# Patient Record
Sex: Female | Born: 1970 | Race: White | Hispanic: No | Marital: Married | Smoking: Never smoker
Health system: Southern US, Community
[De-identification: ages and names within clinical notes are randomized; demographics above are authoritative.]

## PROBLEM LIST (undated history)

## (undated) DIAGNOSIS — F319 Bipolar disorder, unspecified: Secondary | ICD-10-CM

## (undated) DIAGNOSIS — K5792 Diverticulitis of intestine, part unspecified, without perforation or abscess without bleeding: Secondary | ICD-10-CM

## (undated) DIAGNOSIS — F419 Anxiety disorder, unspecified: Secondary | ICD-10-CM

## (undated) DIAGNOSIS — E785 Hyperlipidemia, unspecified: Secondary | ICD-10-CM

## (undated) DIAGNOSIS — F4310 Post-traumatic stress disorder, unspecified: Secondary | ICD-10-CM

## (undated) HISTORY — PX: OTHER SURGICAL HISTORY: SHX169

## (undated) HISTORY — PX: DILATION AND CURETTAGE OF UTERUS: SHX78

---

## 2013-03-10 ENCOUNTER — Encounter: Payer: Self-pay | Admitting: *Deleted

## 2013-03-10 ENCOUNTER — Emergency Department (INDEPENDENT_AMBULATORY_CARE_PROVIDER_SITE_OTHER): Payer: 59

## 2013-03-10 ENCOUNTER — Emergency Department: Payer: Self-pay

## 2013-03-10 ENCOUNTER — Emergency Department (HOSPITAL_COMMUNITY): Payer: Self-pay

## 2013-03-10 ENCOUNTER — Emergency Department
Admission: EM | Admit: 2013-03-10 | Discharge: 2013-03-10 | Disposition: A | Discharge status: Home / Self Care | Payer: Self-pay | Source: Home / Self Care

## 2013-03-10 DIAGNOSIS — R0602 Shortness of breath: Secondary | ICD-10-CM

## 2013-03-10 DIAGNOSIS — R079 Chest pain, unspecified: Secondary | ICD-10-CM

## 2013-03-10 HISTORY — DX: Diverticulitis of intestine, part unspecified, without perforation or abscess without bleeding: K57.92

## 2013-03-10 HISTORY — DX: Bipolar disorder, unspecified: F31.9

## 2013-03-10 HISTORY — DX: Hyperlipidemia, unspecified: E78.5

## 2013-03-10 NOTE — ED Notes (Signed)
Pt c/o CP, SOB, and sweats x 1 day intermittently. She reports having a GI bug earlier this week.

## 2013-03-10 NOTE — ED Provider Notes (Addendum)
History     CSN: [id number]  Arrival date & time 03/10/13  1155   None     Chief Complaint  Patient presents with  . Chest Pain  . Shortness of Breath   HPI  Pt presents today with chief complaint of chest pain:  Onset: 2 days Location: central-right/central chest   Quality: sharp  Duration: seconds to minutes  Relation to rest/exertion:  None. Predominant episode was at rest today.   Radiation: none  Better with: n/a  Worse with: n/a.  CV RFs (HTN, diabetes, smoker, prior MI, family hx/o early MI):  HLD, obesity   Modifying factors: has had viral gastroenteritis around time of CP    Symptoms History of Trauma/lifting: no  Nausea/vomiting: yes, has had viral gastroenteritis   Diaphoresis: yes, has had viral gastroenteritis   Shortness of breath: mild  Pleuritic: no  Cough: no  Edema: no  Orthopnea: no  PND: no  Dizziness: no  Palpitations: no  Syncope: no  Indigestion: nausea with viral gastro    Red Flags Worse with exertion: no  Recent Immobility: no  Cancer history: no  Tearing/radiation to back: no  Prior history of MI:   no Family history of early MI:  Grandfather with MI in 49s       Past Medical History  Diagnosis Date  . Diverticulitis   . Hyperlipemia   . Bipolar 1 disorder     Past Surgical History  Procedure Laterality Date  . Dilation and curettage of uterus      Family History  Problem Relation Age of Onset  . Hypertension Mother   . Thyroid disease Mother     History  Substance Use Topics  . Smoking status: Not on file  . Smokeless tobacco: Not on file  . Alcohol Use: Not on file    OB History   No data available      Review of Systems  All other systems reviewed and are negative.    Allergies  Review of patient's allergies indicates no known allergies.  Home Medications   Current Outpatient Rx  Name  Route  Sig  Dispense  Refill  . ARIPiprazole (ABILIFY) 10 MG tablet   Oral   Take 10 mg by mouth daily.         Marland Kitchen gabapentin (NEURONTIN) 100 MG capsule   Oral   Take 100 mg by mouth 3 (three) times daily.         Marland Kitchen lamoTRIgine (LAMICTAL) 100 MG tablet   Oral   Take 100 mg by mouth daily.         . QUEtiapine (SEROQUEL) 50 MG tablet   Oral   Take 50 mg by mouth at bedtime.           BP 116/83  Pulse 93  Temp(Src) 98.1 F (36.7 C) (Oral)  Ht 5\' 6"  (1.676 m)  Wt 210 lb (95.255 kg)  BMI 33.91 kg/m2  SpO2 98%  LMP 02/27/2013  Physical Exam  Constitutional:  Obese    HENT:  Head: Normocephalic and atraumatic.  Right Ear: External ear normal.  Left Ear: External ear normal.  Mild exophthalmos bilaterally  EOMI    Eyes: Conjunctivae are normal. Pupils are equal, round, and reactive to light.  Neck: Normal range of motion. Neck supple.  Cardiovascular: Normal rate, regular rhythm and normal heart sounds.   Pulmonary/Chest: Effort normal and breath sounds normal.  No chest wall TTP    Abdominal: Soft.  Musculoskeletal: Normal  range of motion.  Neurological: She is alert.  Skin: Skin is warm.    ED Course  Procedures (including critical care time)  Labs Reviewed - No data to display Dg Chest 2 View  03/10/2013  *RADIOLOGY REPORT*  Clinical Data: Chest pain and shortness of breath.  CHEST - 2 VIEW  Comparison: None.  Findings: The heart size and pulmonary vascularity are normal and the lungs are clear.  No acute osseous abnormality.  Slight accentuation of the thoracic kyphosis.  IMPRESSION: No acute disease.   Original Report Authenticated By: Francene Boyers, M.D.    EKG: NSR, no significant ST or T wave abnormalities.   1. Chest pain       MDM  Atypical CP. CXR and EKG WNL. Sxs nonpleuritic/constochrondritic. Pt does have some CV RFs including obesity and HLD. Pt will likely need outpt stress test.  Discussed stat troponin draw here and outpt follow up with PCP about stress test  With CV red flags vs further eval in ER. Husband/pt felt more comfortable in  going to ER for further evaluation. Discussed EMS transfer if they felt that concerned as well as [facility] protocol. Husband refused this. AMA paperwork signed. Husband will drive pt to ER. Copies of EKG and CXR result given to husband and pt.   Greater than 50% of 60 mins spent with pt in terms of direct pt care and care coordination.     The patient and/or caregiver has been counseled thoroughly with regard to treatment plan and/or medications prescribed including dosage, schedule, interactions, rationale for use, and possible side effects and they verbalize understanding. Diagnoses and expected course of recovery discussed and will return if not improved as expected or if the condition worsens. Patient and/or caregiver verbalized understanding.             Doree Albee, MD 03/10/13 1315  Doree Albee, MD 03/10/13 1316

## 2013-03-13 ENCOUNTER — Telehealth: Payer: Self-pay | Admitting: *Deleted

## 2013-09-18 ENCOUNTER — Emergency Department
Admission: EM | Admit: 2013-09-18 | Discharge: 2013-09-18 | Disposition: A | Discharge status: Home / Self Care | Payer: 59 | Source: Home / Self Care | Attending: Family Medicine | Admitting: Family Medicine

## 2013-09-18 DIAGNOSIS — L03011 Cellulitis of right finger: Secondary | ICD-10-CM

## 2013-09-18 DIAGNOSIS — IMO0002 Reserved for concepts with insufficient information to code with codable children: Secondary | ICD-10-CM

## 2013-09-18 MED ORDER — AMOXICILLIN-POT CLAVULANATE 875-125 MG PO TABS: 1.0000 | ORAL_TABLET | Freq: Two times a day (BID) | ORAL | Status: DC

## 2013-09-18 NOTE — ED Notes (Signed)
Cassandra Freeman complains of infected finger for 2 days

## 2013-09-18 NOTE — ED Provider Notes (Signed)
CSN: [id number]     Arrival date & time 09/18/13  1846 History   First MD Initiated Contact with Patient 09/18/13 1855     Chief Complaint  Patient presents with  . Wound Infection    infected finger    HPI  R 3rd finger pain and swelling x 2 days.  Noticed progressive onset of redness and swelling in the nail bed.  No known injury.  Pt does not bite her nails.  Had her nails done last week.  No purulent drainage.    Past Medical History  Diagnosis Date  . Diverticulitis   . Hyperlipemia   . Bipolar 1 disorder    Past Surgical History  Procedure Laterality Date  . Dilation and curettage of uterus     Family History  Problem Relation Age of Onset  . Hypertension Mother   . Thyroid disease Mother    History  Substance Use Topics  . Smoking status: Never Smoker   . Smokeless tobacco: Not on file  . Alcohol Use: Not on file   OB History   Grav Para Term Preterm Abortions TAB SAB Ect Mult Living                 Review of Systems  All other systems reviewed and are negative.    Allergies  Review of patient's allergies indicates no known allergies.  Home Medications   Current Outpatient Rx  Name  Route  Sig  Dispense  Refill  . amoxicillin-clavulanate (AUGMENTIN) 875-125 MG per tablet   Oral   Take 1 tablet by mouth 2 (two) times daily.   20 tablet   0   . ARIPiprazole (ABILIFY) 10 MG tablet   Oral   Take 10 mg by mouth daily.         Marland Kitchen gabapentin (NEURONTIN) 100 MG capsule   Oral   Take 100 mg by mouth 3 (three) times daily.         Marland Kitchen lamoTRIgine (LAMICTAL) 100 MG tablet   Oral   Take 100 mg by mouth daily.         . QUEtiapine (SEROQUEL) 50 MG tablet   Oral   Take 50 mg by mouth at bedtime.          BP 109/71  Pulse 76  Temp(Src) 98.1 F (36.7 C) (Oral)  Ht 5\' 5"  (1.651 m)  Wt 228 lb (103.42 kg)  BMI 37.94 kg/m2  SpO2 100%  LMP 08/28/2013 Physical Exam  Constitutional: She appears well-developed and well-nourished.  HENT:    Head: Normocephalic and atraumatic.  Eyes: Conjunctivae are normal. Pupils are equal, round, and reactive to light.  Neck: Normal range of motion.  Cardiovascular: Normal rate and regular rhythm.   Pulmonary/Chest: Effort normal.  Abdominal: Soft.  Musculoskeletal: Normal range of motion.       Hands: R 3rd finger ulnar sided nail bed soft tissue swelling and redness. Unable to express any purulent fluid.    Skin: Skin is warm.    ED Course  Procedures (including critical care time) Labs Review Labs Reviewed - No data to display Imaging Review No results found.  MDM   1. Paronychia, right    Will place on augmentin for soft tissue coverage.  Warm compresses to affected area.  Discussed general care and infectious/nail bed red flags.  Follow up as needed.     The patient and/or caregiver has been counseled thoroughly with regard to treatment plan and/or medications prescribed including dosage,  schedule, interactions, rationale for use, and possible side effects and they verbalize understanding. Diagnoses and expected course of recovery discussed and will return if not improved as expected or if the condition worsens. Patient and/or caregiver verbalized understanding.         Doree Albee, MD 09/21/13 1113

## 2014-09-22 ENCOUNTER — Emergency Department (INDEPENDENT_AMBULATORY_CARE_PROVIDER_SITE_OTHER): Payer: 59

## 2014-09-22 ENCOUNTER — Encounter: Payer: Self-pay | Admitting: Emergency Medicine

## 2014-09-22 ENCOUNTER — Emergency Department
Admission: EM | Admit: 2014-09-22 | Discharge: 2014-09-22 | Disposition: A | Discharge status: Home / Self Care | Payer: 59 | Source: Home / Self Care | Attending: Family Medicine | Admitting: Family Medicine

## 2014-09-22 DIAGNOSIS — M25462 Effusion, left knee: Secondary | ICD-10-CM

## 2014-09-22 DIAGNOSIS — S8002XA Contusion of left knee, initial encounter: Secondary | ICD-10-CM

## 2014-09-22 NOTE — ED Provider Notes (Signed)
Cassandra NicelyCindy Freeman is a 43 y.o. female who presents to Urgent Care today for left knee injury. Patient fell leaving her therapist's office 4 days ago. She fell down some stairs landing on her left side. She feels well except for pain in her left knee. She notes bruising on the anterior medial aspect of her left knee. The pain is moderate and worse with activity and better with rest. She some heating pad ice and Tylenol. She notes a small blister in the area of the bruising but notes that she fell asleep with a heating pad on her knee and thinks that for the blister came from. No fevers or chills nausea vomiting or diarrhea.   Past Medical History  Diagnosis Date  . Diverticulitis   . Hyperlipemia   . Bipolar 1 disorder    History  Substance Use Topics  . Smoking status: Never Smoker   . Smokeless tobacco: Not on file  . Alcohol Use: No   ROS as above Medications: No current facility-administered medications for this encounter.   Current Outpatient Prescriptions  Medication Sig Dispense Refill  . buPROPion (WELLBUTRIN XL) 300 MG 24 hr tablet Take 300 mg by mouth daily.      . clonazePAM (KLONOPIN) 0.5 MG tablet Take 0.5 mg by mouth 3 (three) times daily as needed for anxiety.      . gabapentin (NEURONTIN) 800 MG tablet Take 2,400 mg by mouth 1 day or 1 dose.      . traZODone (DESYREL) 150 MG tablet Take 150 mg by mouth at bedtime.      Marland Kitchen. amoxicillin-clavulanate (AUGMENTIN) 875-125 MG per tablet Take 1 tablet by mouth 2 (two) times daily.  20 tablet  0  . ARIPiprazole (ABILIFY) 10 MG tablet Take 10 mg by mouth daily.      Marland Kitchen. gabapentin (NEURONTIN) 100 MG capsule Take 2,400 mg by mouth 1 day or 1 dose.       . lamoTRIgine (LAMICTAL) 100 MG tablet Take 200 mg by mouth daily.       Marland Kitchen. lurasidone (LATUDA) 40 MG TABS tablet Take 40 mg by mouth daily with breakfast.      . QUEtiapine (SEROQUEL) 50 MG tablet Take 50 mg by mouth at bedtime.        Exam:  BP 102/80  Pulse 80  Temp(Src) 98.6 F (37  C) (Oral)  Resp 18  Ht 5\' 5"  (1.651 m)  Wt 223 lb (101.152 kg)  BMI 37.11 kg/m2  SpO2 97%  LMP 09/08/2014 Gen: Well NAD HEENT: EOMI,  MMM Lungs: Normal work of breathing. CTABL Heart: RRR no MRG Abd: NABS, Soft. Nondistended, Nontender Exts: Brisk capillary refill, warm and well perfused.  Left knee: Ecchymosis and tenderness at the skin overlying the proximal medial tibia. Knee range of motion is 0-100 with 1+ retropatellar crepitation. Stable ligamentous exam  No results found for this or any previous visit (from the past 24 hour(s)). Dg Knee Complete 4 Views Left  09/22/2014   CLINICAL DATA:  Fall on stairs 4 days ago. Anterior right knee pain. Initial encounter  EXAM: LEFT KNEE - COMPLETE 4+ VIEW  COMPARISON:  None.  FINDINGS: The right knee is located. A small joint effusion is present. Minimal degenerative changes are present in the medial and patellofemoral compartments. No acute osseous abnormality is evident.  IMPRESSION: 1. Small joint effusion without an acute osseous abnormality. Internal derangement is not excluded. 2. Minimal degenerative change.   Electronically Signed   By: Gennette Pachris  Mattern  M.D.   On: 09/22/2014 12:16    Assessment and Plan: 43 y.o. female with knee contusion. Continue Tylenol and rest as needed and followup with orthopedics as needed  Discussed warning signs or symptoms. Please see discharge instructions. Patient expresses understanding.     Rodolph BongEvan S Corey, MD 09/22/14 1249

## 2014-09-22 NOTE — Discharge Instructions (Signed)
Thank you for coming in today. Continue Tylenol Stop using the heating pad. Come back as needed.   Cryotherapy Cryotherapy means treatment with cold. Ice or gel packs can be used to reduce both pain and swelling. Ice is the most helpful within the first 24 to 48 hours after an injury or flare-up from overusing a muscle or joint. Sprains, strains, spasms, burning pain, shooting pain, and aches can all be eased with ice. Ice can also be used when recovering from surgery. Ice is effective, has very few side effects, and is safe for most people to use. PRECAUTIONS  Ice is not a safe treatment option for people with:  Raynaud phenomenon. This is a condition affecting small blood vessels in the extremities. Exposure to cold may cause your problems to return.  Cold hypersensitivity. There are many forms of cold hypersensitivity, including:  Cold urticaria. Red, itchy hives appear on the skin when the tissues begin to warm after being iced.  Cold erythema. This is a red, itchy rash caused by exposure to cold.  Cold hemoglobinuria. Red blood cells break down when the tissues begin to warm after being iced. The hemoglobin that carry oxygen are passed into the urine because they cannot combine with blood proteins fast enough.  Numbness or altered sensitivity in the area being iced. If you have any of the following conditions, do not use ice until you have discussed cryotherapy with your caregiver:  Heart conditions, such as arrhythmia, angina, or chronic heart disease.  High blood pressure.  Healing wounds or open skin in the area being iced.  Current infections.  Rheumatoid arthritis.  Poor circulation.  Diabetes. Ice slows the blood flow in the region it is applied. This is beneficial when trying to stop inflamed tissues from spreading irritating chemicals to surrounding tissues. However, if you expose your skin to cold temperatures for too long or without the proper protection, you can  damage your skin or nerves. Watch for signs of skin damage due to cold. HOME CARE INSTRUCTIONS Follow these tips to use ice and cold packs safely.  Place a dry or damp towel between the ice and skin. A damp towel will cool the skin more quickly, so you may need to shorten the time that the ice is used.  For a more rapid response, add gentle compression to the ice.  Ice for no more than 10 to 20 minutes at a time. The bonier the area you are icing, the less time it will take to get the benefits of ice.  Check your skin after 5 minutes to make sure there are no signs of a poor response to cold or skin damage.  Rest 20 minutes or more between uses.  Once your skin is numb, you can end your treatment. You can test numbness by very lightly touching your skin. The touch should be so light that you do not see the skin dimple from the pressure of your fingertip. When using ice, most people will feel these normal sensations in this order: cold, burning, aching, and numbness.  Do not use ice on someone who cannot communicate their responses to pain, such as small children or people with dementia. HOW TO MAKE AN ICE PACK Ice packs are the most common way to use ice therapy. Other methods include ice massage, ice baths, and cryosprays. Muscle creams that cause a cold, tingly feeling do not offer the same benefits that ice offers and should not be used as a substitute unless recommended  by your caregiver. To make an ice pack, do one of the following:  Place crushed ice or a bag of frozen vegetables in a sealable plastic bag. Squeeze out the excess air. Place this bag inside another plastic bag. Slide the bag into a pillowcase or place a damp towel between your skin and the bag.  Mix 3 parts water with 1 part rubbing alcohol. Freeze the mixture in a sealable plastic bag. When you remove the mixture from the freezer, it will be slushy. Squeeze out the excess air. Place this bag inside another plastic bag.  Slide the bag into a pillowcase or place a damp towel between your skin and the bag. SEEK MEDICAL CARE IF:  You develop white spots on your skin. This may give the skin a blotchy (mottled) appearance.  Your skin turns blue or pale.  Your skin becomes waxy or hard.  Your swelling gets worse. MAKE SURE YOU:   Understand these instructions.  Will watch your condition.  Will get help right away if you are not doing well or get worse. Document Released: 07/27/2011 Document Revised: 04/16/2014 Document Reviewed: 07/27/2011 [facility] Patient Information 2015 [place], [place]. This information is not intended to replace advice given to you by your health care provider. Make sure you discuss any questions you have with your health care provider.  Contusion A contusion is the result of an injury to the skin and underlying tissues and is usually caused by direct trauma. The injury results in the appearance of a bruise on the skin overlying the injured tissues. Contusions cause rupture and bleeding of the small capillaries and blood vessels and affect function, because the bleeding infiltrates muscles, tendons, nerves, or other soft tissues.  SYMPTOMS   Swelling and often a hard lump in the injured area, either superficial or deep.  Pain and tenderness over the area of the contusion.  Feeling of firmness when pressure is exerted over the contusion.  Discoloration under the skin, beginning with redness and progressing to the characteristic "black and blue" bruise. CAUSES  A contusion is typically the result of direct trauma. This is often by a blunt object.  RISK INCREASES WITH:  Sports that have a high likelihood of trauma (football, boxing, ice hockey, soccer, field hockey, martial arts, basketball, and baseball).  Sports that make falling from a height likely (high-jumping, pole-vaulting, skating, or gymnastics).  Any bleeding disorder (hemophilia) or taking medications that affect  clotting (aspirin, nonsteroidal anti-inflammatory medications, or warfarin [Coumadin]).  Inadequate protection of exposed areas during contact sports. PREVENTION  Maintain physical fitness:  Joint and muscle flexibility.  Strength and endurance.  Coordination.  Wear proper protective equipment. Make sure it fits correctly. PROGNOSIS  Contusions typically heal without any complications. Healing time varies with the severity of injury and intake of medications that affect clotting. Contusions usually heal in 1 to 4 weeks. RELATED COMPLICATIONS   Damage to nearby nerves or blood vessels, causing numbness, coldness, or paleness.  Compartment syndrome.  Bleeding into the soft tissues that leads to disability.  Infiltrative-type bleeding, leading to the calcification and impaired function of the injured muscle (rare).  Prolonged healing time if usual activities are resumed too soon.  Infection if the skin over the injury site is broken.  Fracture of the bone underlying the contusion.  Stiffness in the joint where the injured muscle crosses. TREATMENT  Treatment initially consists of resting the injured area as well as medication and ice to reduce inflammation. The use of a compression  bandage may also be helpful in minimizing inflammation. As pain diminishes and movement is tolerated, the joint where the affected muscle crosses should be moved to prevent stiffness and the shortening (contracture) of the joint. Movement of the joint should begin as soon as possible. It is also important to work on maintaining strength within the affected muscles. Occasionally, extra padding over the area of contusion may be recommended before returning to sports, particularly if re-injury is likely.  MEDICATION   If pain relief is necessary these medications are often recommended:  Nonsteroidal anti-inflammatory medications, such as aspirin and ibuprofen.  Other minor pain relievers, such as  acetaminophen, are often recommended.  Prescription pain relievers may be given by your caregiver. Use only as directed and only as much as you need. HEAT AND COLD  Cold treatment (icing) relieves pain and reduces inflammation. Cold treatment should be applied for 10 to 15 minutes every 2 to 3 hours for inflammation and pain and immediately after any activity that aggravates your symptoms. Use ice packs or an ice massage. (To do an ice massage fill a large styrofoam cup with water and freeze. Tear a small amount of foam from the top so ice protrudes. Massage ice firmly over the injured area in a circle about the size of a softball.)  Heat treatment may be used prior to performing the stretching and strengthening activities prescribed by your caregiver, physical therapist, or athletic trainer. Use a heat pack or a warm soak. SEEK MEDICAL CARE IF:   Symptoms get worse or do not improve despite treatment in a few days.  You have difficulty moving a joint.  Any extremity becomes extremely painful, numb, pale, or cool (This is an emergency!).  Medication produces any side effects (bleeding, upset stomach, or allergic reaction).  Signs of infection (drainage from skin, headache, muscle aches, dizziness, fever, or general ill feeling) occur if skin was broken. Document Released: 11/30/2005 Document Revised: 02/22/2012 Document Reviewed: 03/14/2009 [facility]Exit[facility] Patient Information 2015 [place], [place]. This information is not intended to replace advice given to you by your health care provider. Make sure you discuss any questions you have with your health care provider.

## 2014-09-22 NOTE — ED Notes (Signed)
Fell x 4 days ago.  Pain, bruising to left medial knee.  Blister to anterior left knee.  Patient thinks that was caused by use of a heating pad.

## 2015-02-05 ENCOUNTER — Encounter: Payer: Self-pay | Admitting: *Deleted

## 2015-02-05 ENCOUNTER — Emergency Department (INDEPENDENT_AMBULATORY_CARE_PROVIDER_SITE_OTHER): Payer: 59

## 2015-02-05 ENCOUNTER — Emergency Department
Admission: EM | Admit: 2015-02-05 | Discharge: 2015-02-05 | Disposition: A | Discharge status: Home / Self Care | Payer: 59 | Source: Home / Self Care | Attending: Emergency Medicine | Admitting: Emergency Medicine

## 2015-02-05 DIAGNOSIS — M7732 Calcaneal spur, left foot: Secondary | ICD-10-CM

## 2015-02-05 DIAGNOSIS — S93402A Sprain of unspecified ligament of left ankle, initial encounter: Secondary | ICD-10-CM

## 2015-02-05 DIAGNOSIS — S8001XA Contusion of right knee, initial encounter: Secondary | ICD-10-CM

## 2015-02-05 DIAGNOSIS — S5001XA Contusion of right elbow, initial encounter: Secondary | ICD-10-CM

## 2015-02-05 MED ORDER — IBUPROFEN 200 MG PO TABS: ORAL_TABLET | ORAL | Status: DC

## 2015-02-05 NOTE — ED Notes (Addendum)
Pt c/o LT ankle pain, RT elbow and RT knee pain post fall 1130 today. She reports having a "dizzy spell". No LOC. She reports running out of her Vitamin D tabs 1 week ago.

## 2015-02-05 NOTE — ED Provider Notes (Signed)
CSN: [id number]     Arrival date & time 02/05/15  1229 History   None    Chief Complaint  Patient presents with  . Fall   Here with husband HPI Pt c/o moderate to severe sharp LT ankle pain, mild RT elbow and mild RT knee pain post fall 1130 a.m. Today.  She reports having a "dizzy spell" at 11:30 AM, when she was feeling stressed, and accidentally fell causing the injury. Denies loss of consciousness or head injury or headache or focal neurologic symptoms. Denies chest pain or shortness of breath or palpitations. No seizures or incontinence. She states she has felt more stressed and depressed the past week. No suicidal or homicidal ideation. She has chronic depression, on medication by physician and sees a counselor once a month. No recent medication change. History of chronic depression and bipolar 1 disorder. Denies hallucinations. Past Medical History  Diagnosis Date  . Diverticulitis   . Hyperlipemia   . Bipolar 1 disorder    Past Surgical History  Procedure Laterality Date  . Dilation and curettage of uterus    . Fibroma     Family History  Problem Relation Age of Onset  . Hypertension Mother   . Thyroid disease Mother    History  Substance Use Topics  . Smoking status: Never Smoker   . Smokeless tobacco: Not on file  . Alcohol Use: No   OB History    No data available     Review of Systems  Constitutional: Negative for fever.  Respiratory: Negative.   Cardiovascular: Negative for chest pain, palpitations and leg swelling.  Gastrointestinal: Negative for nausea and vomiting.  Neurological: Negative for seizures and syncope.  Psychiatric/Behavioral: Negative for suicidal ideas, hallucinations and self-injury.    Allergies  Review of patient's allergies indicates no known allergies.  Home Medications   Prior to Admission medications   Medication Sig Start Date End Date Taking? Authorizing Provider  ARIPiprazole (ABILIFY) 10 MG tablet Take 10 mg by mouth  daily.    Historical Provider, MD  buPROPion (WELLBUTRIN XL) 300 MG 24 hr tablet Take 300 mg by mouth daily.    Historical Provider, MD  clonazePAM (KLONOPIN) 0.5 MG tablet Take 0.5 mg by mouth 3 (three) times daily as needed for anxiety.    Historical Provider, MD  gabapentin (NEURONTIN) 100 MG capsule Take 2,400 mg by mouth 1 day or 1 dose.     Historical Provider, MD  gabapentin (NEURONTIN) 800 MG tablet Take 2,400 mg by mouth 1 day or 1 dose.    Historical Provider, MD  ibuprofen (ADVIL,MOTRIN) 200 MG tablet Take three tablets ( 600 milligrams total) every 6 with food as needed for pain. 02/05/15   Lajean Manesavid Massey, MD  lamoTRIgine (LAMICTAL) 100 MG tablet Take 200 mg by mouth daily.     Historical Provider, MD  lurasidone (LATUDA) 40 MG TABS tablet Take 40 mg by mouth daily with breakfast.    Historical Provider, MD  QUEtiapine (SEROQUEL) 50 MG tablet Take 50 mg by mouth at bedtime.    Historical Provider, MD  traZODone (DESYREL) 150 MG tablet Take 150 mg by mouth at bedtime.    Historical Provider, MD   BP 104/72 mmHg  Pulse 86  Temp(Src) 97.8 F (36.6 C) (Oral)  Resp 18  Ht 5\' 5"  (1.651 m)  SpO2 100%  LMP 01/28/2015 Physical Exam  Constitutional: She is oriented to person, place, and time. She appears well-developed and well-nourished. No distress.  HENT:  Head:  Normocephalic and atraumatic.  Eyes: Conjunctivae and EOM are normal. Pupils are equal, round, and reactive to light. No scleral icterus.  Neck: Normal range of motion.  No C-spine tenderness or deformity  Cardiovascular: Normal rate.   Pulmonary/Chest: Effort normal.  Abdominal: She exhibits no distension.  Musculoskeletal:       Right elbow: She exhibits normal range of motion, no swelling and no deformity. Tenderness (Minimal diffuse tenderness. No point bony tenderness) found. No radial head, no medial epicondyle, no lateral epicondyle and no olecranon process tenderness noted.       Right knee: She exhibits normal range  of motion, no swelling, no ecchymosis, no deformity and no bony tenderness. Tenderness (Minimal, diffusely right knee) found. No medial joint line, no lateral joint line and no patellar tendon tenderness noted.       Left ankle: She exhibits decreased range of motion, swelling and ecchymosis. She exhibits no deformity, no laceration and normal pulse. Tenderness. Lateral malleolus and AITFL tenderness found. No medial malleolus, no posterior TFL, no head of 5th metatarsal and no proximal fibula tenderness found. Achilles tendon normal.  Neurological: She is alert and oriented to person, place, and time. She has normal strength. No sensory deficit.  She is able to weight-bear, but gait is slow, favoring left ankle because of pain.  Skin: Skin is warm.  Psychiatric: She has a normal mood and affect.  Nursing note and vitals reviewed.   ED Course  Procedures (including critical care time) Labs Review Labs Reviewed - No data to display  Imaging Review Dg Ankle Complete Left  02/05/2015   CLINICAL DATA:  Fall, twisted left ankle  EXAM: LEFT ANKLE COMPLETE - 3+ VIEW  COMPARISON:  None.  FINDINGS: Three views of left ankle submitted. No acute fracture or subluxation. No radiopaque foreign body. Tiny plantar and posterior spur of calcaneus. Ankle mortise is preserved.  IMPRESSION: No acute fracture or subluxation. Tiny plantar and posterior spur of calcaneus.   Electronically Signed   By: Natasha Mead M.D.   On: 02/05/2015 13:26     MDM   1. Left ankle sprain, initial encounter   2. Contusion, elbow, right, initial encounter   3. Contusion, knee, right, initial encounter    Ace bandage applied left ankle. She declined ankle brace as she has one at home.  Advise crutches.  she declined crutches as she has one at home. Encourage rest, ice, compression with ACE bandage, and elevation of injured body part. Ibuprofen 600 mg q 6 hrs with food when necessary pain. She declined prescription pain  meds. Follow-up with your orthopedist 5-7 days if not improving, or sooner if symptoms become worse. Also advised to follow-up in the next week with her counselor and physician for ongoing management of depression. Precautions discussed. Red flags discussed. Questions invited and answered. Patient and husband voiced understanding and agreement.     Lajean Manes, MD 02/05/15 1340

## 2016-02-05 ENCOUNTER — Encounter: Payer: Self-pay | Admitting: *Deleted

## 2016-02-05 ENCOUNTER — Telehealth: Payer: Self-pay | Admitting: *Deleted

## 2016-02-05 ENCOUNTER — Emergency Department (INDEPENDENT_AMBULATORY_CARE_PROVIDER_SITE_OTHER): Payer: 59

## 2016-02-05 ENCOUNTER — Emergency Department
Admission: EM | Admit: 2016-02-05 | Discharge: 2016-02-05 | Disposition: A | Discharge status: Home / Self Care | Payer: 59 | Source: Home / Self Care | Attending: Family Medicine | Admitting: Family Medicine

## 2016-02-05 DIAGNOSIS — M940 Chondrocostal junction syndrome [Tietze]: Secondary | ICD-10-CM

## 2016-02-05 DIAGNOSIS — R079 Chest pain, unspecified: Secondary | ICD-10-CM | POA: Diagnosis not present

## 2016-02-05 HISTORY — DX: Anxiety disorder, unspecified: F41.9

## 2016-02-05 HISTORY — DX: Post-traumatic stress disorder, unspecified: F43.10

## 2016-02-05 MED ORDER — MELOXICAM 15 MG PO TABS: 15.0000 mg | ORAL_TABLET | Freq: Every day | ORAL | Status: DC

## 2016-02-05 MED ORDER — PREDNISONE 20 MG PO TABS: 20.0000 mg | ORAL_TABLET | Freq: Two times a day (BID) | ORAL | Status: DC

## 2016-02-05 NOTE — Discharge Instructions (Signed)
Apply ice pack for 20 to 30 minutes, 3 to 4 times daily  Continue until pain decreases.  ° ° °Costochondritis °Costochondritis, sometimes called Tietze syndrome, is a swelling and irritation (inflammation) of the tissue (cartilage) that connects your ribs with your breastbone (sternum). It causes pain in the chest and rib area. Costochondritis usually goes away on its own over time. It can take up to 6 weeks or longer to get better, especially if you are unable to limit your activities. °CAUSES  °Some cases of costochondritis have no known cause. Possible causes include: °· Injury (trauma). °· Exercise or activity such as lifting. °· Severe coughing. °SIGNS AND SYMPTOMS °· Pain and tenderness in the chest and rib area. °· Pain that gets worse when coughing or taking deep breaths. °· Pain that gets worse with specific movements. °DIAGNOSIS  °Your health care provider will do a physical exam and ask about your symptoms. Chest X-rays or other tests may be done to rule out other problems. °TREATMENT  °Costochondritis usually goes away on its own over time. Your health care provider may prescribe medicine to help relieve pain. °HOME CARE INSTRUCTIONS  °· Avoid exhausting physical activity. Try not to strain your ribs during normal activity. This would include any activities using chest, abdominal, and side muscles, especially if heavy weights are used. °· Apply ice to the affected area for the first 2 days after the pain begins. °¨ Put ice in a plastic bag. °¨ Place a towel between your skin and the bag. °¨ Leave the ice on for 20 minutes, 2-3 times a day. °· Only take over-the-counter or prescription medicines as directed by your health care provider. °SEEK MEDICAL CARE IF: °· You have redness or swelling at the rib joints. These are signs of infection. °· Your pain does not go away despite rest or medicine. °SEEK IMMEDIATE MEDICAL CARE IF:  °· Your pain increases or you are very uncomfortable. °· You have shortness of  breath or difficulty breathing. °· You cough up blood. °· You have worse chest pains, sweating, or vomiting. °· You have a fever or persistent symptoms for more than 2-3 days. °· You have a fever and your symptoms suddenly get worse. °MAKE SURE YOU:  °· Understand these instructions. °· Will watch your condition. °· Will get help right away if you are not doing well or get worse. °  °This information is not intended to replace advice given to you by your health care provider. Make sure you discuss any questions you have with your health care provider. °  °Document Released: 09/09/2005 Document Revised: 09/20/2013 Document Reviewed: 07/04/2013 °Elsevier Interactive Patient Education ©2016 [employer] ° °

## 2016-02-05 NOTE — ED Provider Notes (Signed)
CSN: [id number]     Arrival date & time 02/05/16  1405 History   First MD Initiated Contact with Patient 02/05/16 1514     Chief Complaint  Patient presents with  . Chest Pain      HPI Comments: Patient complains of onset of dull ache in her left chest one week ago while at work.  The pain is generally constant and does not radiate.  No shortness of breath.  No cough.  No fevers, chills, and sweats.  No swelling or pain in lower legs.  The pain is not worse with activity. She admits that she has increased anxiety.  Patient is a 45 y.o. female presenting with chest pain. The history is provided by the patient and the spouse.  Chest Pain Pain location:  L chest Pain quality: aching   Pain radiates to:  Does not radiate Pain radiates to the back: no   Pain severity:  Mild Onset quality:  Sudden Duration:  1 week Timing:  Constant Progression:  Unchanged Chronicity:  New Context: movement, at rest and stress   Relieved by:  Nothing Worsened by:  Certain positions Ineffective treatments:  None tried Associated symptoms: anxiety and shortness of breath   Associated symptoms: no abdominal pain, no AICD problem, no anorexia, no back pain, no claudication, no cough, no diaphoresis, no dizziness, no dysphagia, no fatigue, no fever, no headache, no heartburn, no lower extremity edema, no nausea, no palpitations, no PND, no syncope and not vomiting   Risk factors: obesity   Risk factors: no prior DVT/PE     Past Medical History  Diagnosis Date  . Diverticulitis   . Hyperlipemia   . Bipolar 1 disorder (HCC)   . PTSD (post-traumatic stress disorder)   . Anxiety    Past Surgical History  Procedure Laterality Date  . Dilation and curettage of uterus    . Fibroma     Family History  Problem Relation Age of Onset  . Hypertension Mother   . Thyroid disease Mother    Social History  Substance Use Topics  . Smoking status: Never Smoker   . Smokeless tobacco: None  . Alcohol Use: Yes     OB History    No data available     Review of Systems  Constitutional: Negative for fever, diaphoresis and fatigue.  HENT: Negative for trouble swallowing.   Respiratory: Positive for shortness of breath. Negative for cough.   Cardiovascular: Positive for chest pain. Negative for palpitations, claudication, syncope and PND.  Gastrointestinal: Negative for heartburn, nausea, vomiting, abdominal pain and anorexia.  Musculoskeletal: Negative for back pain.  Neurological: Negative for dizziness and headaches.  All other systems reviewed and are negative.   Allergies  Review of patient's allergies indicates no known allergies.  Home Medications   Prior to Admission medications   Medication Sig Start Date End Date Taking? Authorizing Provider  buPROPion (WELLBUTRIN XL) 300 MG 24 hr tablet Take 300 mg by mouth daily.   Yes Historical Provider, MD  clonazePAM (KLONOPIN) 0.5 MG tablet Take 0.5 mg by mouth 3 (three) times daily as needed for anxiety.   Yes Historical Provider, MD  gabapentin (NEURONTIN) 100 MG capsule Take 2,400 mg by mouth 1 day or 1 dose.    Yes Historical Provider, MD  lamoTRIgine (LAMICTAL) 100 MG tablet Take 200 mg by mouth daily.    Yes Historical Provider, MD  lurasidone (LATUDA) 40 MG TABS tablet Take 40 mg by mouth daily with breakfast.  Yes Historical Provider, MD  traZODone (DESYREL) 150 MG tablet Take 150 mg by mouth at bedtime.   Yes Historical Provider, MD  gabapentin (NEURONTIN) 800 MG tablet Take 2,400 mg by mouth 1 day or 1 dose.    Historical Provider, MD  ibuprofen (ADVIL,MOTRIN) 200 MG tablet Take three tablets ( 600 milligrams total) every 6 with food as needed for pain. 02/05/15   Lajean Manes, MD  predniSONE (DELTASONE) 20 MG tablet Take 1 tablet (20 mg total) by mouth 2 (two) times daily. Take with food. 02/05/16   Lattie Haw, MD   Meds Ordered and Administered this Visit  Medications - No data to display  BP 113/74 mmHg  Pulse 81  Temp(Src)  98.2 F (36.8 C) (Oral)  Resp 16  Ht  (1.651 m)  Wt 212 lb (96.163 kg)  BMI 35.28 kg/m2  SpO2 99% No data found.   Physical Exam  Constitutional: She is oriented to person, place, and time. She appears well-developed and well-nourished.  HENT:  Head: Normocephalic.  Mouth/Throat: Oropharynx is clear and moist.  Eyes: Conjunctivae are normal. Pupils are equal, round, and reactive to light.  Neck: Neck supple.  Cardiovascular: Regular rhythm, normal heart sounds and intact distal pulses.   Pulmonary/Chest: Breath sounds normal. She is in respiratory distress. She has no wheezes. She exhibits tenderness.    Chest:  Distinct tenderness to palpation over the mid-sternum.  Palpation there recreates her pain.  Abdominal: Bowel sounds are normal. She exhibits no distension. There is no tenderness.  Musculoskeletal: She exhibits no edema or tenderness.  Lymphadenopathy:    She has no cervical adenopathy.  Neurological: She is alert and oriented to person, place, and time.  Skin: Skin is warm and dry. No rash noted.  Nursing note and vitals reviewed.   ED Course  Procedures  None     Labs Reviewed -    EKG: Rate:  67 BPM PR:  118 msec QT:  394 msec QTcH:  406 msec QRSD:  114 msec QRS axis:  -21 degrees Interpretation:  Sinus rhythm; short PR syndrome.  No acute changes.  No significant change from previous tracing.  Imaging Review Dg Chest 2 View  02/05/2016  CLINICAL DATA:  45 year old female with left anterior chest wall pain for 1 week with no known injury. Initial encounter. EXAM: CHEST  2 VIEW COMPARISON:  03/10/2013 FINDINGS: Stable lung volumes, low normal. Normal cardiac size and mediastinal contours. Visualized tracheal air column is within normal limits. The lungs remain clear. No pneumothorax or pleural effusion. No acute osseous abnormality identified. The anterior clear space appears normal. IMPRESSION: No acute cardiopulmonary abnormality. Electronically  Signed   By: Odessa Fleming M.D.   On: 02/05/2016 15:59      MDM   1. Costochondritis    Begin prednisone burst. Apply ice pack for 20 to 30 minutes, 3 to 4 times daily  Continue until pain decreases.  Followup with Dr. Rodney Langton or Dr. Clement[facility] ([facility]) if not improving about two weeks.     Lattie Haw, MD 02/05/16 [phone]

## 2016-02-05 NOTE — ED Notes (Signed)
Pt c/o LT sided CP, nausea and SOB x 1wk. She reports increased anxiety.

## 2016-09-19 ENCOUNTER — Encounter: Payer: Self-pay | Admitting: Emergency Medicine

## 2016-09-19 ENCOUNTER — Emergency Department (INDEPENDENT_AMBULATORY_CARE_PROVIDER_SITE_OTHER)
Admission: EM | Admit: 2016-09-19 | Discharge: 2016-09-19 | Disposition: A | Discharge status: Home / Self Care | Payer: 59 | Source: Home / Self Care | Attending: Family Medicine | Admitting: Family Medicine

## 2016-09-19 DIAGNOSIS — J020 Streptococcal pharyngitis: Secondary | ICD-10-CM

## 2016-09-19 LAB — POCT INFLUENZA A/B
Influenza A, POC: NEGATIVE
Influenza B, POC: NEGATIVE

## 2016-09-19 LAB — POCT RAPID STREP A (OFFICE): Rapid Strep A Screen: POSITIVE — AB

## 2016-09-19 MED ORDER — AMOXICILLIN 500 MG PO CAPS: 500.0000 mg | ORAL_CAPSULE | Freq: Two times a day (BID) | ORAL | 0 refills | Status: DC

## 2016-09-19 NOTE — Discharge Instructions (Signed)

## 2016-09-19 NOTE — ED Triage Notes (Signed)
Patient [facility] with C/O sore throat fever generalized achy

## 2016-09-19 NOTE — ED Provider Notes (Signed)
CSN: [id number]     Arrival date & time 09/19/16  1407 History   First MD Initiated Contact with Patient 09/19/16 1428     Chief Complaint  Patient presents with  . Sore Throat  . Fever   (Consider location/radiation/quality/duration/timing/severity/associated sxs/prior Treatment) HPI  Cassandra Freeman is a 45 y.o. female presenting to UC with c/o sore throat that started 2 days ago with associated generalized headache, body aches, and fatigue.  Throat pain is mild to moderate in severity. Worse with swallowing. Decreased appetite. 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  Temp of 102*F at home this morning. She took acetaminophen around 10AM and Alkaseltzer cold around 1PM today.  Denies n/v/d. Denies cough or congestion.  She has not received flu vaccine this season.    Past Medical History:  Diagnosis Date  . Anxiety   . Bipolar 1 disorder (HCC)   . Diverticulitis   . Hyperlipemia   . PTSD (post-traumatic stress disorder)    Past Surgical History:  Procedure Laterality Date  . DILATION AND CURETTAGE OF UTERUS    . fibroma     Family History  Problem Relation Age of Onset  . Hypertension Mother   . Thyroid disease Mother    Social History  Substance Use Topics  . Smoking status: Never Smoker  . Smokeless tobacco: Never Used  . Alcohol use Yes   OB History    No data available     Review of Systems  Constitutional: Positive for appetite change, chills, fatigue and fever.  HENT: Positive for sore throat. Negative for congestion, ear pain, trouble swallowing and voice change.   Respiratory: Negative for cough and shortness of breath.   Cardiovascular: Negative for chest pain and palpitations.  Gastrointestinal: Negative for abdominal pain, diarrhea, nausea and vomiting.  Musculoskeletal: Positive for arthralgias and myalgias. Negative for back pain.       Body aches  Skin: Negative for rash.  Neurological: Positive for headaches. Negative for  dizziness and light-headedness.    Allergies  Review of patient's allergies indicates no known allergies.  Home Medications   Prior to Admission medications   Medication Sig Start Date End Date Taking? Authorizing Provider  amoxicillin (AMOXIL) 500 MG capsule Take 1 capsule (500 mg total) by mouth 2 (two) times daily. For 10 days 09/19/16   Junius Finner, PA-C  buPROPion (WELLBUTRIN XL) 300 MG 24 hr tablet Take 300 mg by mouth daily.    Historical Provider, MD  clonazePAM (KLONOPIN) 0.5 MG tablet Take 0.5 mg by mouth 3 (three) times daily as needed for anxiety.    Historical Provider, MD  gabapentin (NEURONTIN) 100 MG capsule Take 2,400 mg by mouth 1 day or 1 dose.     Historical Provider, MD  gabapentin (NEURONTIN) 800 MG tablet Take 2,400 mg by mouth 1 day or 1 dose.    Historical Provider, MD  ibuprofen (ADVIL,MOTRIN) 200 MG tablet Take three tablets ( 600 milligrams total) every 6 with food as needed for pain. 02/05/15   Lajean Manes, MD  lamoTRIgine (LAMICTAL) 100 MG tablet Take 200 mg by mouth daily.     Historical Provider, MD  lurasidone (LATUDA) 40 MG TABS tablet Take 40 mg by mouth daily with breakfast.    Historical Provider, MD  meloxicam (MOBIC) 15 MG tablet Take 1 tablet (15 mg total) by mouth daily. Take with breakfast. 02/05/16   Lattie Haw, MD  predniSONE (DELTASONE) 20 MG tablet Take 1  tablet (20 mg total) by mouth 2 (two) times daily. Take with food. 02/05/16   Lattie HawStephen A Beese, MD  traZODone (DESYREL) 150 MG tablet Take 150 mg by mouth at bedtime.    Historical Provider, MD   Meds Ordered and Administered this Visit  Medications - No data to display  BP 121/78 (BP Location: Left Arm)   Pulse 96   Temp 100.3 F (37.9 C) (Oral)   Resp 16   Ht 5\' 5"  (1.651 m)   Wt 212 lb (96.2 kg)   SpO2 98%   BMI 35.28 kg/m  No data found.   Physical Exam  Constitutional: She appears well-developed and well-nourished. No distress.  HENT:  Head: Normocephalic and atraumatic.   Right Ear: Tympanic membrane normal.  Left Ear: Tympanic membrane normal.  Nose: Nose normal.  Mouth/Throat: Uvula is midline and mucous membranes are normal. Posterior oropharyngeal edema and posterior oropharyngeal erythema present. No oropharyngeal exudate or tonsillar abscesses.  Eyes: Conjunctivae are normal. No scleral icterus.  Neck: Normal range of motion.  Cardiovascular: Normal rate, regular rhythm and normal heart sounds.   Pulmonary/Chest: Effort normal and breath sounds normal. No stridor. No respiratory distress. She has no wheezes. She has no rales.  Abdominal: Soft. She exhibits no distension and no mass. There is no tenderness. There is no rebound and no guarding.  Musculoskeletal: Normal range of motion.  Lymphadenopathy:    She has cervical adenopathy.  Neurological: She is alert.  Skin: Skin is warm and dry. She is not diaphoretic.  Nursing note and vitals reviewed.   Urgent Care Course   Clinical Course    Procedures (including critical care time)  Labs Review Labs Reviewed  POCT RAPID STREP A (OFFICE) - Abnormal; Notable for the following:       Result Value   Rapid Strep A Screen Positive (*)    All other components within normal limits  POCT INFLUENZA A/B    Imaging Review No results found.   MDM   1. Strep pharyngitis     Pt c/o flu-like symptoms including body aches, fatigue, lack of appetite, headache and sore throat for 2 days. Fever Tmax 102*F this morning.  Tonsillar erythema and edema with cervical lymphadenopathy noted on exam. Rapid strep: POSITIVE Rapid flu: Negative  Rx: Amoxicillin  Advised pt to use acetaminophen and ibuprofen as needed for fever and pain. Encouraged rest and fluids. F/u with PCP in 5-7 days if not improving, sooner if worsening. Pt verbalized understanding and agreement with tx plan.     Junius FinnerErin O'Malley, PA-C 09/19/16 1459

## 2017-06-24 ENCOUNTER — Emergency Department
Admission: EM | Admit: 2017-06-24 | Discharge: 2017-06-24 | Disposition: A | Discharge status: Home / Self Care | Payer: 59 | Source: Home / Self Care | Attending: Family Medicine | Admitting: Family Medicine

## 2017-06-24 ENCOUNTER — Encounter: Payer: Self-pay | Admitting: Emergency Medicine

## 2017-06-24 ENCOUNTER — Other Ambulatory Visit: Payer: Self-pay | Admitting: Family Medicine

## 2017-06-24 DIAGNOSIS — R197 Diarrhea, unspecified: Secondary | ICD-10-CM

## 2017-06-24 DIAGNOSIS — K5732 Diverticulitis of large intestine without perforation or abscess without bleeding: Secondary | ICD-10-CM

## 2017-06-24 LAB — POCT URINALYSIS DIP (MANUAL ENTRY)
BILIRUBIN UA: NEGATIVE
Glucose, UA: NEGATIVE mg/dL
Leukocytes, UA: NEGATIVE
NITRITE UA: NEGATIVE
Protein Ur, POC: NEGATIVE mg/dL
SPEC GRAV UA: 1.02 (ref 1.010–1.025)
UROBILINOGEN UA: NEGATIVE U/dL — AB
pH, UA: 5.5 (ref 5.0–8.0)

## 2017-06-24 MED ORDER — AMOXICILLIN-POT CLAVULANATE 875-125 MG PO TABS: 1.0000 | ORAL_TABLET | Freq: Two times a day (BID) | ORAL | 0 refills | Status: DC

## 2017-06-24 NOTE — Discharge Instructions (Signed)
Begin clear liquids for about 18 to 24 hours, then may begin a SU[employer]BR[employer] diet (Bananas, Rice, Applesauce, Toast).  Then gradually advance to a regular diet as tolerated.  Avoid milk products until well. Return a stool specimen for culture. If symptoms become significantly worse during the night or over the weekend, proceed to the local emergency room.

## 2017-06-24 NOTE — ED Triage Notes (Signed)
Pt c/o abdominal pain and cramping x3 weeks, worsens after eating and c/o frequent loose stools.

## 2017-06-24 NOTE — ED Provider Notes (Signed)
[facility]    CSN: [id number] Arrival date & time: 06/24/17  1759     History   Chief Complaint Chief Complaint  Patient presents with  . Abdominal Pain    HPI Cassandra Freeman is a 46 y.o. female.   Patient complains of 3 to 4 week history of stabbing pain in her left lower abdomen, occurring about 30 minutes after eating.  She has had occasional nausea without vomiting, and her appetite has been decreased.  She has had night sweats.  She also notes that she has had persistent watery stools and diarrhea since coming home from the beach one month ago (her husband has been assymptomatic).  She denies urinary symptoms, but confides that her urine has had a strange odor for several weeks. She has a history of sigmoid diverticulosis and adenomatous colon polyps followed by gastroenterologist Dr. Noelle PennerGibbs.  She had an episode of diverticulitis 06/23/16 that did not respond to Cipro/Flagyl, but resolved with Augmentin.  She notes that her present pain is somewhat different from past episodes of diverticulitis. Past history of endometrial ablation 2 years ago.   The history is provided by the patient and the spouse.  Abdominal Pain  Pain location:  LLQ Pain quality: stabbing   Pain radiates to:  L flank Pain severity:  Moderate Onset quality:  Sudden Duration:  3 weeks Timing:  Intermittent Progression:  Worsening Chronicity:  Recurrent Context: awakening from sleep and eating   Relieved by:  Nothing Worsened by:  Eating Ineffective treatments:  None tried Associated symptoms: anorexia, chills, diarrhea, fatigue and nausea   Associated symptoms: no belching, no chest pain, no constipation, no cough, no dysuria, no fever, no flatus, no hematemesis, no hematochezia, no hematuria, no melena, no shortness of breath, no sore throat, no vaginal bleeding, no vaginal discharge and no vomiting     Past Medical History:  Diagnosis Date  . Anxiety   . Bipolar 1 disorder (HCC)   .  Diverticulitis   . Hyperlipemia   . PTSD (post-traumatic stress disorder)     There are no active problems to display for this patient.   Past Surgical History:  Procedure Laterality Date  . DILATION AND CURETTAGE OF UTERUS    . fibroma      OB History    No data available       Home Medications    Prior to Admission medications   Medication Sig Start Date End Date Taking? Authorizing Provider  amoxicillin (AMOXIL) 500 MG capsule Take 1 capsule (500 mg total) by mouth 2 (two) times daily. For 10 days 09/19/16   Lurene ShadowPhelps, Erin O, PA-C  amoxicillin-clavulanate (AUGMENTIN) 875-125 MG tablet Take 1 tablet by mouth 2 (two) times daily. 06/24/17   Lattie HawBeese, Daymien Goth A, MD  buPROPion (WELLBUTRIN XL) 300 MG 24 hr tablet Take 300 mg by mouth daily.    [provider]  clonazePAM (KLONOPIN) 0.5 MG tablet Take 0.5 mg by mouth 3 (three) times daily as needed for anxiety.    [provider]  gabapentin (NEURONTIN) 100 MG capsule Take 2,400 mg by mouth 1 day or 1 dose.     [provider]  gabapentin (NEURONTIN) 800 MG tablet Take 2,400 mg by mouth 1 day or 1 dose.    [provider]  ibuprofen (ADVIL,MOTRIN) 200 MG tablet Take three tablets ( 600 milligrams total) every 6 with food as needed for pain. 02/05/15   Lajean ManesMassey, David, MD  lamoTRIgine (LAMICTAL) 100 MG tablet Take  200 mg by mouth daily.     [provider]  lurasidone (LATUDA) 40 MG TABS tablet Take 40 mg by mouth daily with breakfast.    [provider]  meloxicam (MOBIC) 15 MG tablet Take 1 tablet (15 mg total) by mouth daily. Take with breakfast. 02/05/16   Lattie Haw, MD  predniSONE (DELTASONE) 20 MG tablet Take 1 tablet (20 mg total) by mouth 2 (two) times daily. Take with food. 02/05/16   Lattie Haw, MD  traZODone (DESYREL) 150 MG tablet Take 150 mg by mouth at bedtime.    [provider]    Family History Family History  Problem Relation Age of Onset  .  Hypertension Mother   . Thyroid disease Mother     Social History Social History  Substance Use Topics  . Smoking status: Never Smoker  . Smokeless tobacco: Never Used  . Alcohol use Yes     Allergies   Patient has no known allergies.   Review of Systems Review of Systems  Constitutional: Positive for chills and fatigue. Negative for fever.  HENT: Negative for sore throat.   Respiratory: Negative for cough and shortness of breath.   Cardiovascular: Negative for chest pain.  Gastrointestinal: Positive for abdominal pain, anorexia, diarrhea and nausea. Negative for constipation, flatus, hematemesis, hematochezia, melena and vomiting.  Genitourinary: Negative for dysuria, hematuria, vaginal bleeding and vaginal discharge.  All other systems reviewed and are negative.    Physical Exam Triage Vital Signs ED Triage Vitals  Enc Vitals Group     BP 06/24/17 1838 108/62     Pulse Rate 06/24/17 1838 78     Resp --      Temp 06/24/17 1838 98.4 F (36.9 C)     Temp Source 06/24/17 1838 Oral     SpO2 06/24/17 1838 98 %     Weight 06/24/17 1839 214 lb (97.1 kg)     Height --      Head Circumference --      Peak Flow --      Pain Score 06/24/17 1840 0     Pain Loc --      Pain Edu? --      Excl. in GC? --    No data found.   Updated Vital Signs BP 108/62 (BP Location: Right Arm)   Pulse 78   Temp 98.4 F (36.9 C) (Oral)   Wt 214 lb (97.1 kg)   SpO2 98%   BMI 35.61 kg/m   Visual Acuity Right Eye Distance:   Left Eye Distance:   Bilateral Distance:    Right Eye Near:   Left Eye Near:    Bilateral Near:     Physical Exam  Constitutional: She appears well-developed and well-nourished. No distress.  HENT:  Head: Normocephalic.  Right Ear: External ear normal.  Left Ear: External ear normal.  Nose: Nose normal.  Mouth/Throat: Oropharynx is clear and moist.  Eyes: Pupils are equal, round, and reactive to light. Conjunctivae are normal.  Neck: Neck supple.    Cardiovascular: Normal heart sounds.   Pulmonary/Chest: Breath sounds normal.  Abdominal: Bowel sounds are normal. She exhibits no distension. There is no hepatosplenomegaly. There is tenderness in the left upper quadrant and left lower quadrant. There is no tenderness at McBurney's point and negative Murphy's sign.    Lymphadenopathy:    She has no cervical adenopathy.  Neurological: She is alert.  Skin: Skin is warm and dry. No rash noted.  Nursing  note and vitals reviewed.    UC Treatments / Results  Labs (all labs ordered are listed, but only abnormal results are displayed) Labs Reviewed  POCT URINALYSIS DIP (MANUAL ENTRY) - Abnormal; Notable for the following:       Result Value   Clarity, UA cloudy (*)    Ketones, POC UA trace (5) (*)    Blood, UA small (*)    Urobilinogen, UA negative (*)    All other components within normal limits  STOOL CULTURE  URINE CULTURE  AMYLASE   Narrative:    Performed at:  [facility]                [address], [address]                [place], [place] [address]  LIPASE   Narrative:    Performed at:  [facility]                [address], [address]                [place], [place] [address]  COMPLETE METABOLIC PANEL WITH GFR   Narrative:    Performed at:  [facility]                [address]                [place], [place] [address]  POCT CBC:  WBC 12.9; LY 28.9; MO 6.1; GR 65.0; Hgb 12.4; Platelets 292   EKG  EKG Interpretation None       Radiology No results found.  Procedures Procedures (including critical care time)  Medications Ordered in UC Medications - No data to display   Initial Impression / Assessment and Plan / UC Course  I have reviewed the triage vital signs and the nursing notes.  Pertinent labs & imaging results that were available during my care of the patient were reviewed by me and considered in my medical decision making (see chart for details).     Note leukocytosis 12.9.  CMP, amylase, and lipase pending. Begin Augmentin 875 BID for 10 days (did not respond well in past to Cipro/Flagyl). Begin clear liquids for about 18 to 24 hours, then may begin a [employer] (Bananas, Rice, Applesauce, Toast).  Then gradually advance to a regular diet as tolerated.  Avoid milk products until well. Because of her one month history of diarrhea, will send stool specimen for culture (?giardiasis). If symptoms become significantly worse during the night or over the weekend, proceed to the local emergency room.  Followup with gastroenterologist Dr. Noelle Penner.    Final Clinical Impressions(s) / UC Diagnoses   Final diagnoses:  Diverticulitis of colon  Diarrhea, unspecified type    New Prescriptions Discharge Medication List as of 06/24/2017  7:18 PM    START taking these medications   Details  amoxicillin-clavulanate (AUGMENTIN) 875-125 MG tablet Take 1 tablet by mouth 2 (two) times daily., Starting Thu 06/24/2017, Normal         Lattie Haw, MD 06/25/17 1024

## 2017-06-25 LAB — COMPLETE METABOLIC PANEL WITH GFR
ALBUMIN: 4.4 g/dL (ref 3.6–5.1)
ALK PHOS: 62 U/L (ref 33–115)
ALT: 12 U/L (ref 6–29)
AST: 13 U/L (ref 10–35)
BILIRUBIN TOTAL: 0.3 mg/dL (ref 0.2–1.2)
BUN: 15 mg/dL (ref 7–25)
CO2: 23 mmol/L (ref 20–31)
CREATININE: 1.06 mg/dL (ref 0.50–1.10)
Calcium: 9.1 mg/dL (ref 8.6–10.2)
Chloride: 102 mmol/L (ref 98–110)
GFR, Est African American: 72 mL/min (ref >=60)
GFR, Est Non African American: 63 mL/min (ref >=60)
GLUCOSE: 89 mg/dL (ref 65–99)
Potassium: 4 mmol/L (ref 3.5–5.3)
SODIUM: 136 mmol/L (ref 135–146)
Total Protein: 6.6 g/dL (ref 6.1–8.1)

## 2017-06-25 LAB — LIPASE: Lipase: 15 U/L (ref 7–60)

## 2017-06-25 LAB — AMYLASE: Amylase: 32 U/L (ref 21–101)

## 2017-06-27 ENCOUNTER — Telehealth: Payer: Self-pay

## 2017-06-27 LAB — URINE CULTURE

## 2017-06-27 NOTE — Telephone Encounter (Signed)
Feeling much better.  Will follow up with PCP or [facility] as needed.

## 2017-06-28 ENCOUNTER — Telehealth: Payer: Self-pay

## 2017-06-28 LAB — POCT CBC W AUTO DIFF (K'VILLE URGENT CARE)

## 2017-06-28 NOTE — Telephone Encounter (Signed)
Encounter opened to order CBC

## 2017-06-30 LAB — STOOL CULTURE

## 2017-07-06 ENCOUNTER — Telehealth: Payer: Self-pay | Admitting: Emergency Medicine

## 2017-10-06 IMAGING — CR DG CHEST 2V
2 series · 2 of 2 positions shown · non-contrast
Comparison: 03/10/2013

CLINICAL DATA: 45-year-old female with left anterior chest wall
pain for 1 week with no known injury. Initial encounter.

EXAM:
CHEST  2 VIEW

[chest pa]
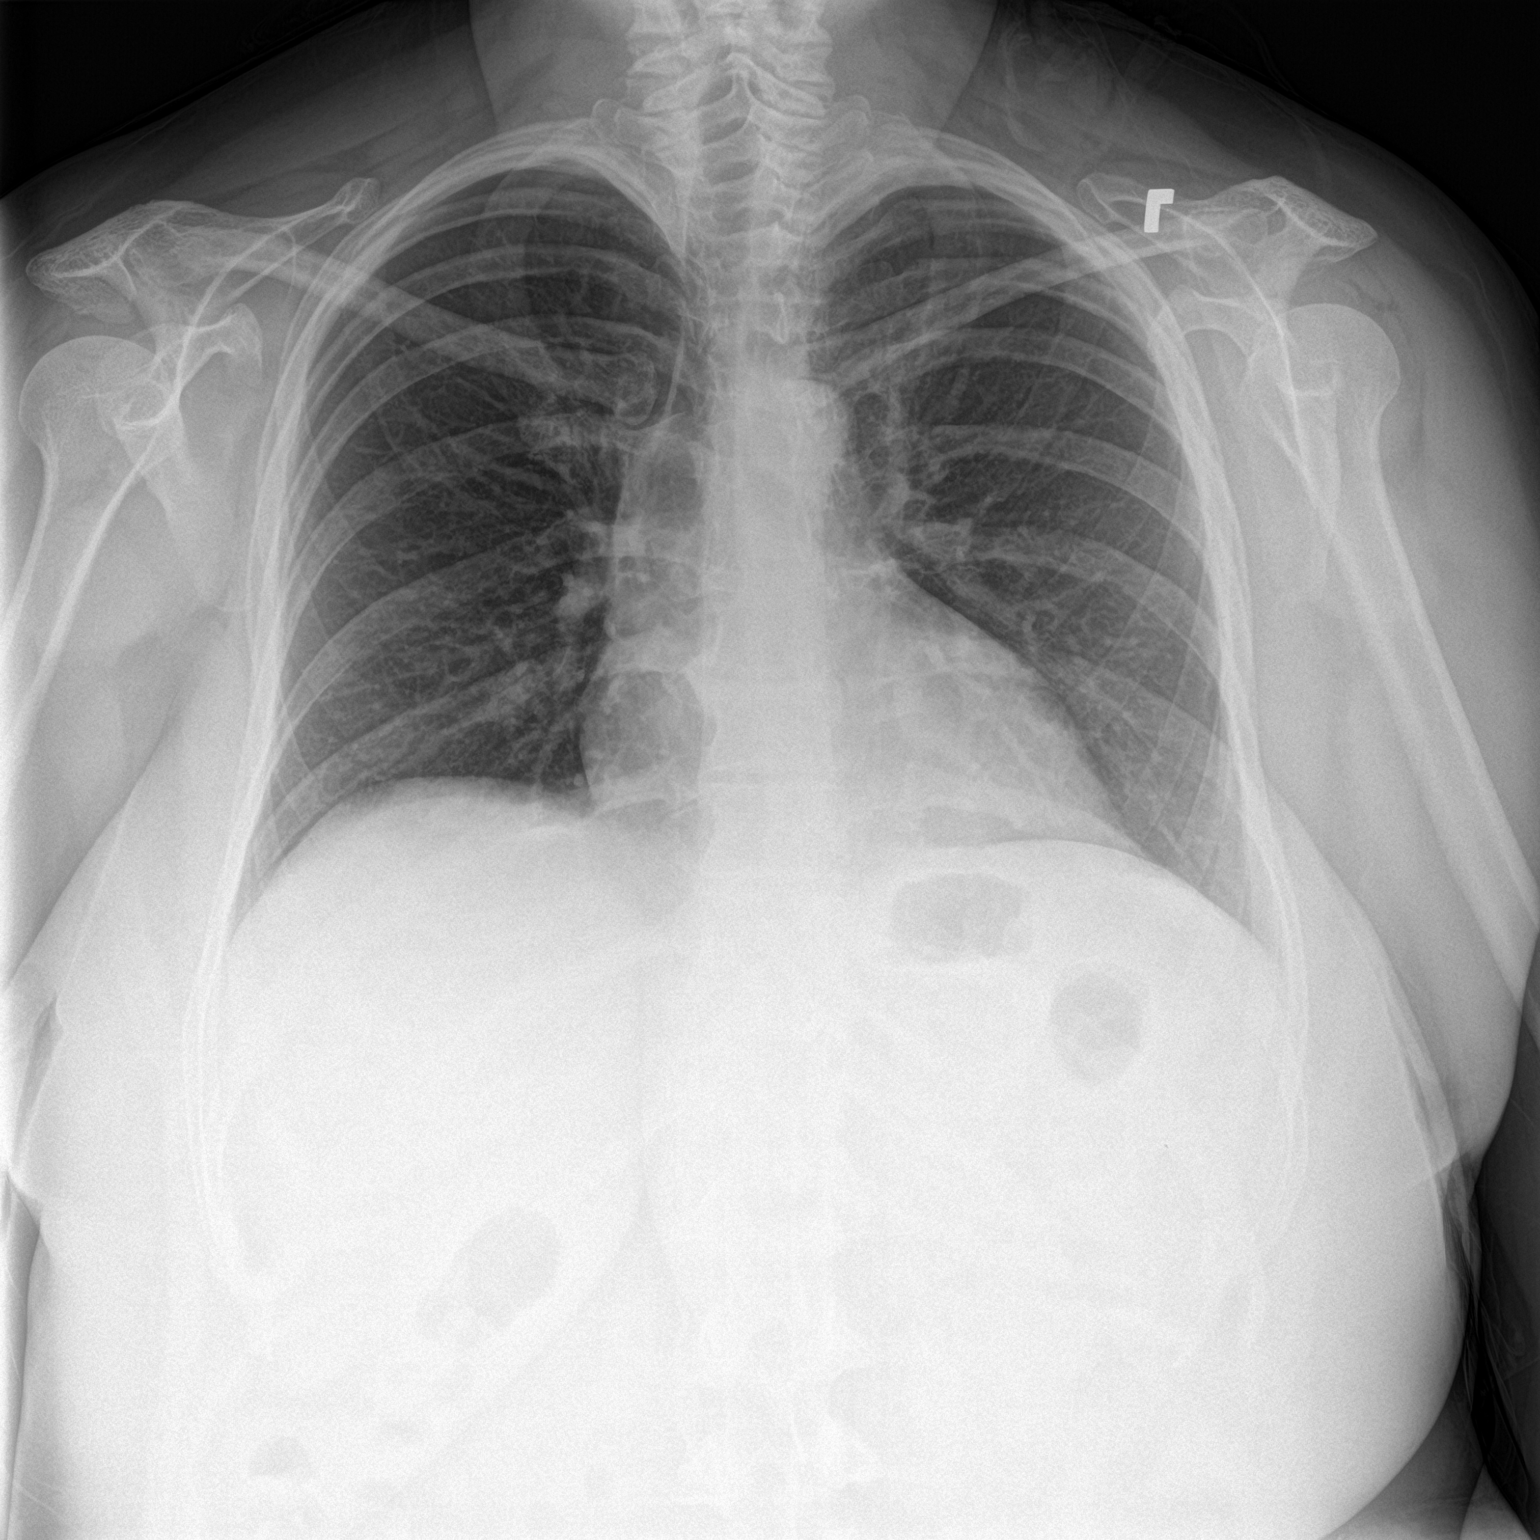

[chest lat]
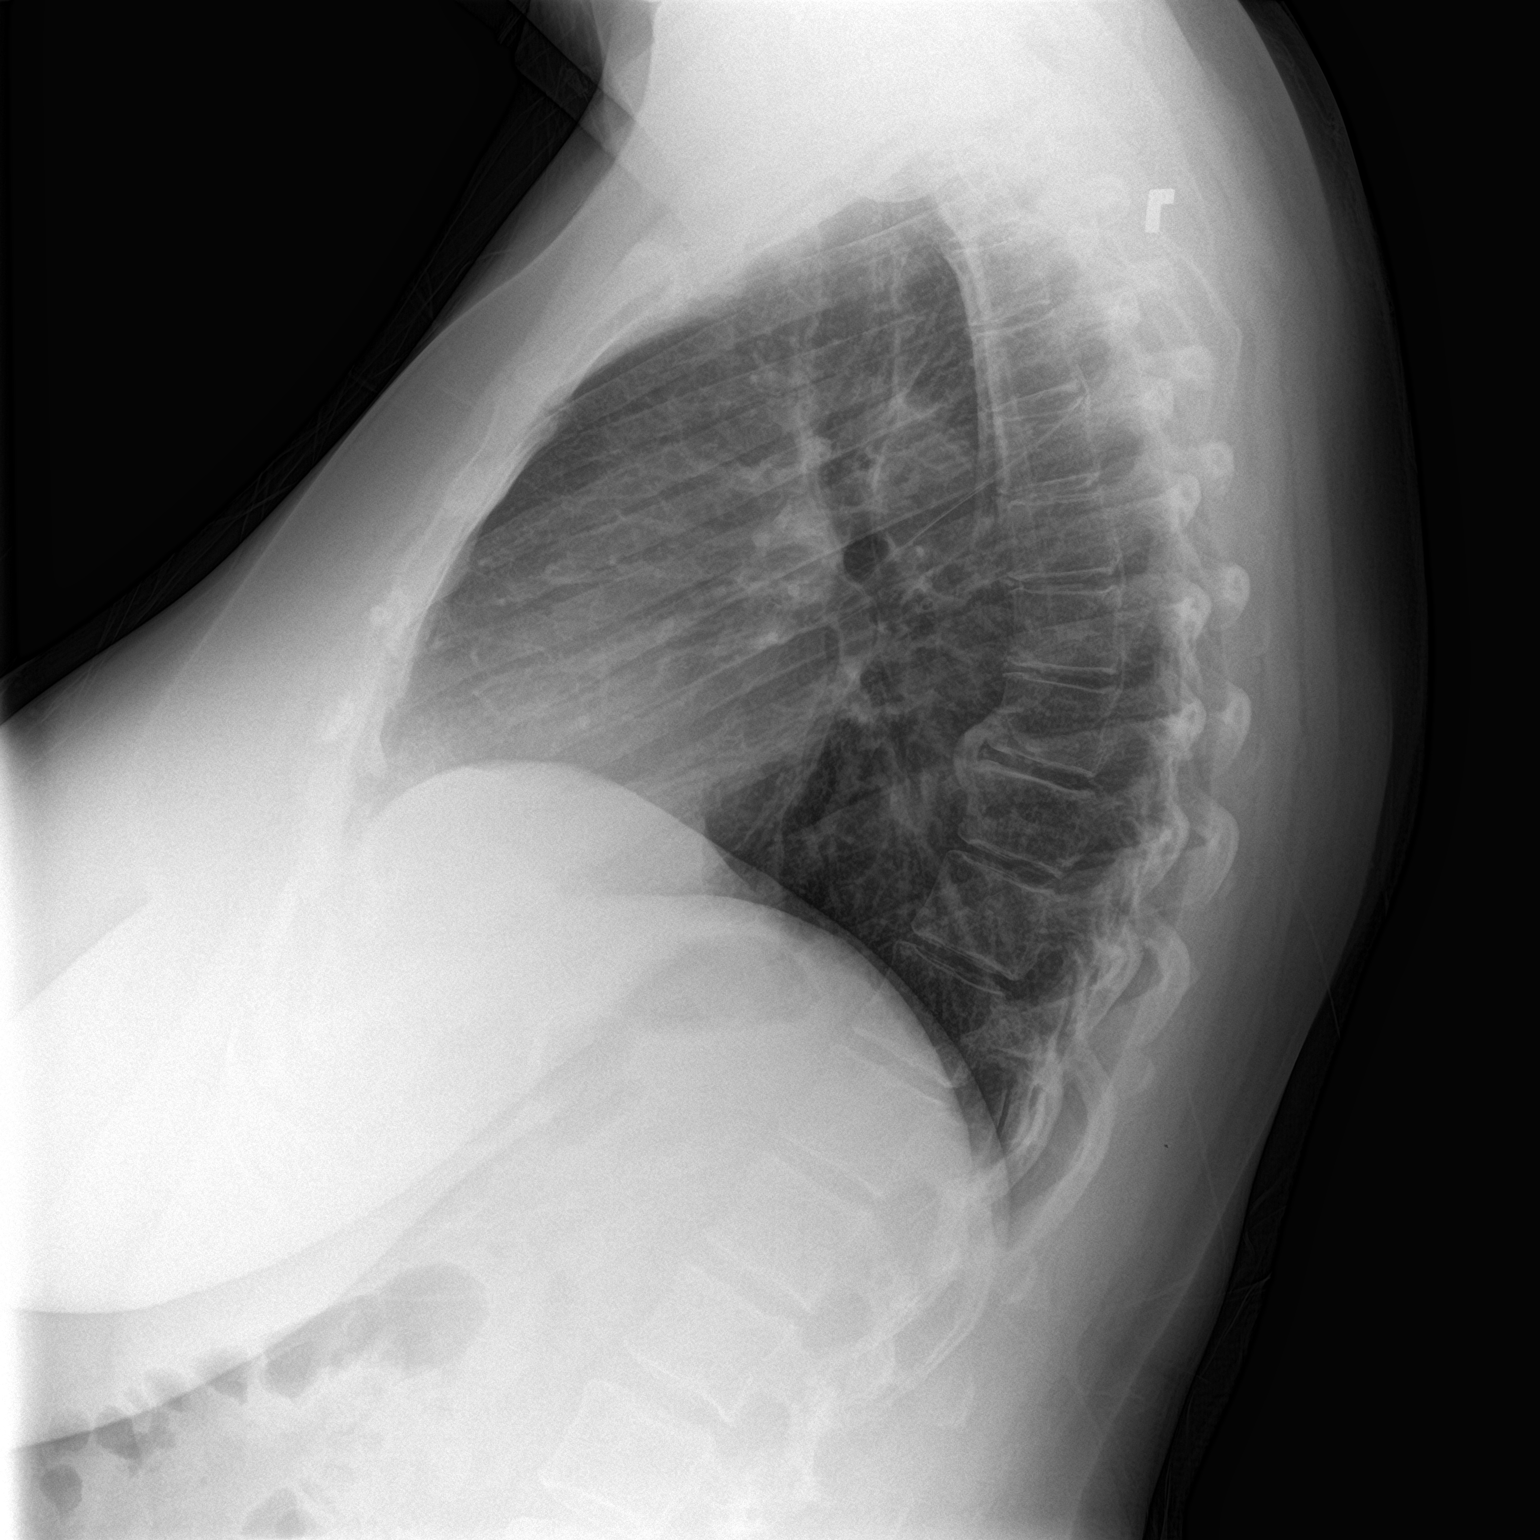

[2 of 2 positions shown; findings below may reference images not displayed]

FINDINGS: Stable lung volumes, low normal. Normal cardiac size and mediastinal
contours. Visualized tracheal air column is within normal limits.
The lungs remain clear. No pneumothorax or pleural effusion. No
acute osseous abnormality identified. The anterior clear space
appears normal.
IMPRESSION: No acute cardiopulmonary abnormality.

## 2020-08-06 ENCOUNTER — Emergency Department
Admission: EM | Admit: 2020-08-06 | Discharge: 2020-08-06 | Disposition: A | Discharge status: Home / Self Care | Payer: 59 | Source: Home / Self Care | Attending: Family Medicine | Admitting: Family Medicine

## 2020-08-06 ENCOUNTER — Other Ambulatory Visit: Payer: Self-pay

## 2020-08-06 DIAGNOSIS — T675XXA Heat exhaustion, unspecified, initial encounter: Secondary | ICD-10-CM

## 2020-08-06 NOTE — ED Triage Notes (Signed)
Patient presents to Urgent Care with complaints of shortness of breath, shaking, and overheating since this afternoon. 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 School nurse checked her HR and it was 128. Also has a headache. Pt has had 3 16-oz bottles of water today.

## 2020-08-06 NOTE — ED Provider Notes (Signed)
[facility]    CSN: [id number] Arrival date & time: 08/06/20  1701      History   Chief Complaint Chief Complaint  Patient presents with  . Shortness of Breath    HPI Alexandrina Fiorini is a 49 y.o. female.   She is presenting with an elevated heart rate, lightheadedness, nausea all following while being outside for period of time.  She was provided ice and removed from the heat and her symptoms have slowly improved.  No history of similar symptoms.  Did not lose consciousness.  Did not have an episode of emesis.  HPI  Past Medical History:  Diagnosis Date  . Anxiety   . Bipolar 1 disorder (HCC)   . Diverticulitis   . Hyperlipemia   . PTSD (post-traumatic stress disorder)     There are no problems to display for this patient.   Past Surgical History:  Procedure Laterality Date  . DILATION AND CURETTAGE OF UTERUS    . fibroma      OB History   No obstetric history on file.      Home Medications    Prior to Admission medications   Medication Sig Start Date End Date Taking? Authorizing Provider  amoxicillin (AMOXIL) 500 MG capsule Take 1 capsule (500 mg total) by mouth 2 (two) times daily. For 10 days 09/19/16   Lurene Shadow, PA-C  amoxicillin-clavulanate (AUGMENTIN) 875-125 MG tablet Take 1 tablet by mouth 2 (two) times daily. 06/24/17   Lattie Haw, MD  buPROPion (WELLBUTRIN XL) 300 MG 24 hr tablet Take 300 mg by mouth daily.    [provider]  clonazePAM (KLONOPIN) 0.5 MG tablet Take 0.5 mg by mouth 3 (three) times daily as needed for anxiety.    [provider]  gabapentin (NEURONTIN) 100 MG capsule Take 2,400 mg by mouth 1 day or 1 dose.     [provider]  gabapentin (NEURONTIN) 800 MG tablet Take 2,400 mg by mouth 1 day or 1 dose.    [provider]  ibuprofen (ADVIL,MOTRIN) 200 MG tablet Take three tablets ( 600 milligrams total) every 6 with food as needed for pain. 02/05/15   Lajean Manes, MD   lamoTRIgine (LAMICTAL) 100 MG tablet Take 200 mg by mouth daily.     [provider]  lurasidone (LATUDA) 40 MG TABS tablet Take 40 mg by mouth daily with breakfast.    [provider]  meloxicam (MOBIC) 15 MG tablet Take 1 tablet (15 mg total) by mouth daily. Take with breakfast. 02/05/16   Lattie Haw, MD  predniSONE (DELTASONE) 20 MG tablet Take 1 tablet (20 mg total) by mouth 2 (two) times daily. Take with food. 02/05/16   Lattie Haw, MD  traZODone (DESYREL) 150 MG tablet Take 150 mg by mouth at bedtime.    [provider]    Family History Family History  Problem Relation Age of Onset  . Hypertension Mother   . Thyroid disease Mother     Social History Social History   Tobacco Use  . Smoking status: Never Smoker  . Smokeless tobacco: Never Used  Vaping Use  . Vaping Use: Never used  Substance Use Topics  . Alcohol use: Yes    Comment: rare  . Drug use: No     Allergies   Patient has no known allergies.   Review of Systems Review of Systems  See HPI  Physical Exam Triage Vital Signs ED Triage Vitals  Enc  Vitals Group     BP 08/06/20 1729 124/82     Pulse Rate 08/06/20 1729 89     Resp 08/06/20 1729 18     Temp 08/06/20 1729 99 F (37.2 C)     Temp Source 08/06/20 1729 Oral     SpO2 08/06/20 1729 98 %     Weight --      Height --      Head Circumference --      Peak Flow --      Pain Score 08/06/20 1726 0     Pain Loc --      Pain Edu? --      Excl. in GC? --    No data found.  Updated Vital Signs BP 124/82 (BP Location: Right Arm)   Pulse 89   Temp 99 F (37.2 C) (Oral)   Resp 18   SpO2 98%   Visual Acuity Right Eye Distance:   Left Eye Distance:   Bilateral Distance:    Right Eye Near:   Left Eye Near:    Bilateral Near:     Physical Exam Gen: NAD, alert, cooperative with exam, well-appearing ENT: normal lips, normal nasal mucosa,  Eye: normal EOM, normal conjunctiva and lids CV: Regular rate  and rhythm Resp: no accessory muscle use, non-labored,  Skin: no rashes, no areas of induration  Neuro: normal tone, normal sensation to touch Psych:  normal insight, alert and oriented MSK:  Normal gait. Neurovascularly intact   UC Treatments / Results  Labs (all labs ordered are listed, but only abnormal results are displayed) Labs Reviewed - No data to display  EKG   Radiology No results found.  Procedures Procedures (including critical care time)  Medications Ordered in UC Medications - No data to display  Initial Impression / Assessment and Plan / UC Course  I have reviewed the triage vital signs and the nursing notes.  Pertinent labs & imaging results that were available during my care of the patient were reviewed by me and considered in my medical decision making (see chart for details).     Ms. Alwin is a 49 year old female that is presenting with symptoms suggestive of heat illness.  Counseled on acclimatization and limiting her exposure to heat.  Counseled on supportive care.  Given indications on follow-up.  Final Clinical Impressions(s) / UC Diagnoses   Final diagnoses:  Heat exhaustion, initial encounter     Discharge Instructions     Please stay well hydrated  Please build up to the amount of time that you spend outside  Please follow upif your symptoms fail to improve.     ED Prescriptions    None     PDMP not reviewed this encounter.   Myra Rude, MD 08/06/20 2129

## 2020-08-06 NOTE — Discharge Instructions (Signed)
Please stay well hydrated  Please build up to the amount of time that you spend outside  Please follow upif your symptoms fail to improve.

## 2021-07-31 ENCOUNTER — Other Ambulatory Visit (HOSPITAL_COMMUNITY): Payer: Self-pay

## 2023-03-22 ENCOUNTER — Ambulatory Visit
Admission: RE | Admit: 2023-03-22 | Discharge: 2023-03-22 | Disposition: A | Discharge status: Home / Self Care | Payer: 59 | Source: Ambulatory Visit | Attending: Family Medicine | Admitting: Family Medicine

## 2023-03-22 VITALS — BP 126/82 | HR 84 | Temp 98.8°F | Resp 14 | Ht 65.0 in | Wt 212.0 lb

## 2023-03-22 DIAGNOSIS — R300 Dysuria: Secondary | ICD-10-CM | POA: Diagnosis not present

## 2023-03-22 DIAGNOSIS — R350 Frequency of micturition: Secondary | ICD-10-CM | POA: Diagnosis not present

## 2023-03-22 LAB — POCT URINALYSIS DIP (MANUAL ENTRY)
Bilirubin, UA: NEGATIVE
Blood, UA: NEGATIVE
Glucose, UA: NEGATIVE mg/dL
Ketones, POC UA: NEGATIVE mg/dL
Leukocytes, UA: NEGATIVE
Nitrite, UA: NEGATIVE
Protein Ur, POC: NEGATIVE mg/dL
Spec Grav, UA: 1.025 (ref 1.010–1.025)
Urobilinogen, UA: 0.2 E.U./dL
pH, UA: 6 (ref 5.0–8.0)

## 2023-03-22 MED ORDER — NITROFURANTOIN MONOHYD MACRO 100 MG PO CAPS: 100.0000 mg | ORAL_CAPSULE | Freq: Two times a day (BID) | ORAL | 0 refills | Status: AC

## 2023-03-22 NOTE — ED Triage Notes (Signed)
Bladder pressure and discomfort x several days  Denies burning  Denies hx of kidney stones

## 2023-03-22 NOTE — ED Provider Notes (Signed)
[facility]    CSN: [id number] Arrival date & time: 03/22/23  1745      History   Chief Complaint Chief Complaint  Patient presents with   Dysuria    HPI Cassandra Freeman is a 52 y.o. female.   HPI  Patient is having some voiding problems and bladder irritative symptoms.  She states that her bladder feels full and there is a pressure sensation all the time.  When she empties her bladder it is uncomfortable.  And then a few minutes later she feels like she has to go again and only has a dribble.  This has been going on for a couple of days.  No fever or chills.  No nausea or vomiting.  No flank pain.  No history of recurring urinary problems  Past Medical History:  Diagnosis Date   Anxiety    Bipolar 1 disorder    Diverticulitis    Hyperlipemia    PTSD (post-traumatic stress disorder)     There are no problems to display for this patient.   Past Surgical History:  Procedure Laterality Date   DILATION AND CURETTAGE OF UTERUS     fibroma      OB History   No obstetric history on file.      Home Medications    Prior to Admission medications   Medication Sig Start Date End Date Taking? Authorizing Provider  nitrofurantoin, macrocrystal-monohydrate, (MACROBID) 100 MG capsule Take 1 capsule (100 mg total) by mouth 2 (two) times daily. 03/22/23  Yes Eustace Moore, MD  buPROPion (WELLBUTRIN XL) 300 MG 24 hr tablet Take 300 mg by mouth daily.    [provider]  Cholecalciferol 125 MCG (5000 UT) capsule Take 1 tablet by mouth every evening.    [provider]  estradiol (VIVELLE-DOT) 0.05 MG/24HR patch APPLY 1 PATCH TRANSDERMALLY TWICE A WEEK    [provider]  gabapentin (NEURONTIN) 600 MG tablet Take 1,200 mg by mouth at bedtime.    [provider]  lamoTRIgine (LAMICTAL) 100 MG tablet Take 200 mg by mouth daily.     [provider]  Multiple Vitamin (THERA) TABS Take 1 tablet by mouth daily.    [provider]  progesterone (PROMETRIUM) 100 MG capsule TAKE 1 CAPSULE BY MOUTH EVERY DAY FOR 90 DAYS    [provider]    Family History Family History  Problem Relation Age of Onset   Hypertension Mother    Thyroid disease Mother     Social History Social History   Tobacco Use   Smoking status: Never   Smokeless tobacco: Never  Vaping Use   Vaping Use: Never used  Substance Use Topics   Alcohol use: Yes    Comment: rare   Drug use: No     Allergies   Patient has no known allergies.   Review of Systems Review of Systems  See HPI Physical Exam Triage Vital Signs ED Triage Vitals [03/22/23 1758]  Enc Vitals Group     BP 126/82     Pulse Rate 84     Resp 14     Temp 98.8 F (37.1 C)     Temp Source Oral     SpO2 98 %     Weight 212 lb (96.2 kg)     Height 5\' 5"  (1.651 m)     Head Circumference      Peak Flow      Pain Score 1  Pain Loc      Pain Edu?      Excl. in GC?    No data found.  Updated Vital Signs BP 126/82 (BP Location: Left Arm)   Pulse 84   Temp 98.8 F (37.1 C) (Oral)   Resp 14   Ht 5\' 5"  (1.651 m)   Wt 96.2 kg   SpO2 98%   BMI 35.28 kg/m      Physical Exam Constitutional:      General: She is not in acute distress.    Appearance: She is well-developed.  HENT:     Head: Normocephalic and atraumatic.  Eyes:     Conjunctiva/sclera: Conjunctivae normal.     Pupils: Pupils are equal, round, and reactive to light.  Cardiovascular:     Rate and Rhythm: Normal rate.  Pulmonary:     Effort: Pulmonary effort is normal. No respiratory distress.  Abdominal:     General: There is no distension.     Palpations: Abdomen is soft.     Tenderness: There is no right CVA tenderness or left CVA tenderness.  Musculoskeletal:        General: Normal range of motion.     Cervical back: Normal range of motion.  Skin:    General: Skin is warm and dry.  Neurological:     Mental Status: She is alert.      UC Treatments /  Results  Labs (all labs ordered are listed, but only abnormal results are displayed) Labs Reviewed  URINE CULTURE  POCT URINALYSIS DIP (MANUAL ENTRY)    EKG   Radiology No results found.  Procedures Procedures (including critical care time)  Medications Ordered in UC Medications - No data to display  Initial Impression / Assessment and Plan / UC Course  I have reviewed the triage vital signs and the nursing notes.  Pertinent labs & imaging results that were available during my care of the patient were reviewed by me and considered in my medical decision making (see chart for details).     I discussed with the patient other causes of dysuria.  Her urinalysis is unremarkable.  Will send for culture.  Follow-up with PCP if fails to improve Final Clinical Impressions(s) / UC Diagnoses   Final diagnoses:  Dysuria  Urinary frequency     Discharge Instructions      Take the antibiotic 2 times a day  Drink lots of water  Your urine sample has been sent to the laboratory for culture.  You will be called if any change in antibiotic is necessary   ED Prescriptions     Medication Sig Dispense Auth. Provider   nitrofurantoin, macrocrystal-monohydrate, (MACROBID) 100 MG capsule Take 1 capsule (100 mg total) by mouth 2 (two) times daily. 10 capsule Eustace Moore, MD      PDMP not reviewed this encounter.   Eustace Moore, MD 03/22/23 [phone]

## 2023-03-22 NOTE — Discharge Instructions (Addendum)
Take the antibiotic 2 times a day  Drink lots of water  Your urine sample has been sent to the laboratory for culture.  You will be called if any change in antibiotic is necessary

## 2023-03-24 LAB — URINE CULTURE

## 2024-12-27 ENCOUNTER — Other Ambulatory Visit: Payer: Self-pay | Admitting: Family Medicine

## 2024-12-27 ENCOUNTER — Ambulatory Visit
Admission: RE | Admit: 2024-12-27 | Discharge: 2024-12-27 | Disposition: A | Discharge status: Home / Self Care | Payer: Worker's Compensation | Source: Ambulatory Visit | Attending: Family Medicine | Admitting: Family Medicine

## 2024-12-27 DIAGNOSIS — M25571 Pain in right ankle and joints of right foot: Secondary | ICD-10-CM

## 2024-12-27 DIAGNOSIS — M25561 Pain in right knee: Secondary | ICD-10-CM
